# Patient Record
Sex: Female | Born: 1965
Health system: Southern US, Community
[De-identification: ages and names within clinical notes are randomized; demographics above are authoritative.]

## PROBLEM LIST (undated history)

## (undated) DIAGNOSIS — M549 Dorsalgia, unspecified: Secondary | ICD-10-CM

## (undated) DIAGNOSIS — K635 Polyp of colon: Secondary | ICD-10-CM

## (undated) DIAGNOSIS — R51 Headache: Secondary | ICD-10-CM

## (undated) DIAGNOSIS — R519 Headache, unspecified: Secondary | ICD-10-CM

## (undated) HISTORY — PX: COLONOSCOPY: SHX174

## (undated) HISTORY — PX: CHOLECYSTECTOMY: SHX55

## (undated) HISTORY — DX: Polyp of colon: K63.5

## (undated) HISTORY — PX: OTHER SURGICAL HISTORY: SHX169

## (undated) HISTORY — PX: TUBAL LIGATION: SHX77

## (undated) HISTORY — DX: Dorsalgia, unspecified: M54.9

---

## 2001-01-09 ENCOUNTER — Other Ambulatory Visit: Admission: RE | Admit: 2001-01-09 | Discharge: 2001-01-09 | Payer: Self-pay | Admitting: Family Medicine

## 2002-06-05 ENCOUNTER — Other Ambulatory Visit: Admission: RE | Admit: 2002-06-05 | Discharge: 2002-06-05 | Payer: Self-pay | Admitting: Family Medicine

## 2005-09-27 ENCOUNTER — Ambulatory Visit (HOSPITAL_COMMUNITY): Admission: RE | Admit: 2005-09-27 | Discharge: 2005-09-27 | Payer: Self-pay | Admitting: General Surgery

## 2007-07-02 IMAGING — CR DG ABDOMEN ACUTE W/ 1V CHEST
3 series · 3 of 3 positions shown · non-contrast
Comparison: none

HISTORY: Severe constipation, chronic abdominal pain

[view not recorded (1 of 3)]
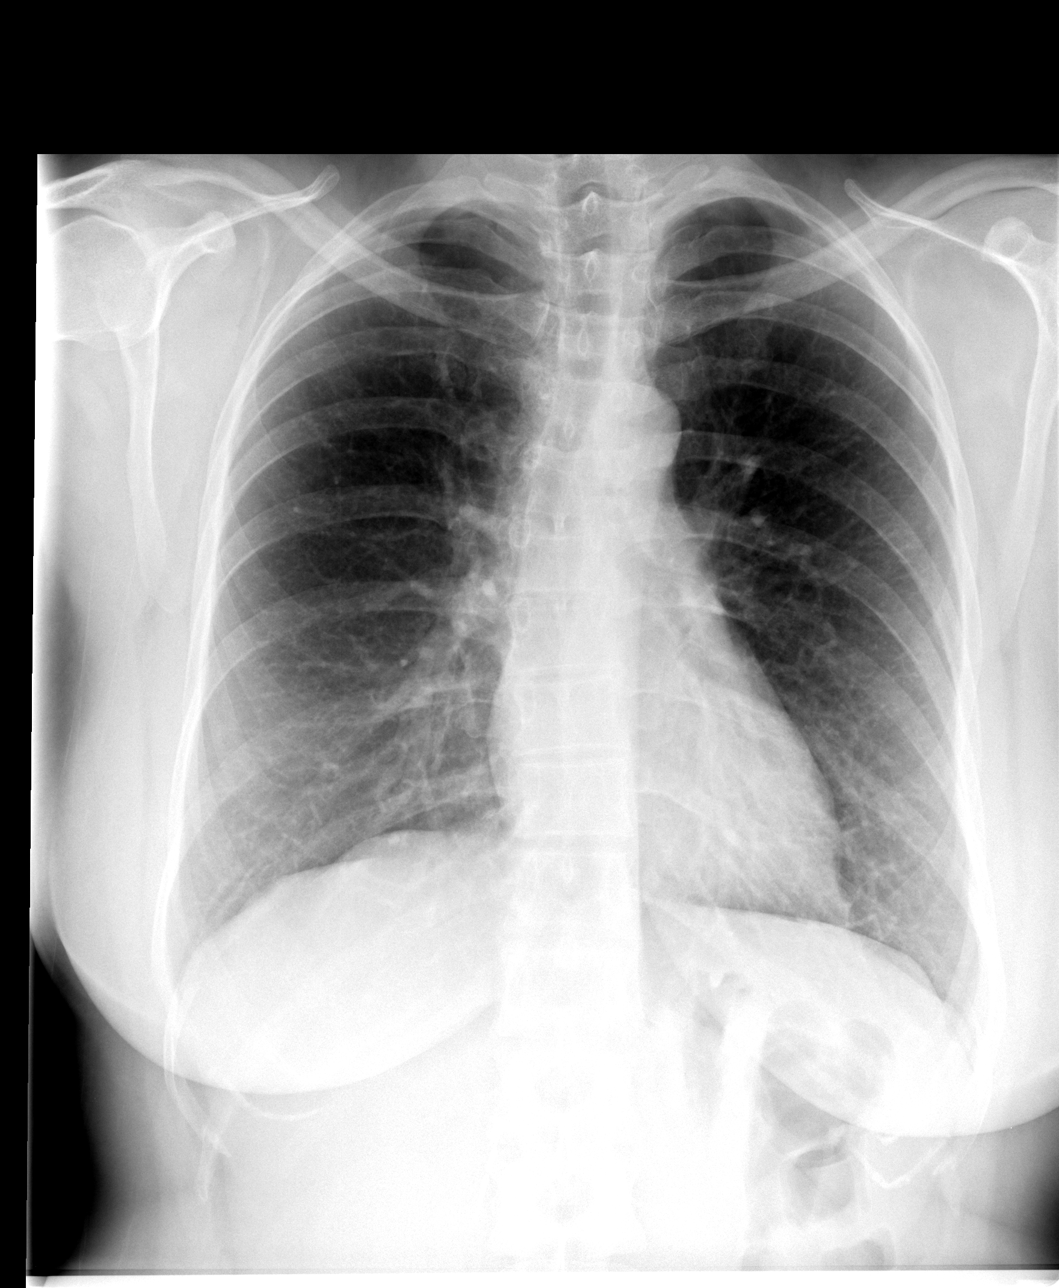

[view not recorded (2 of 3)]
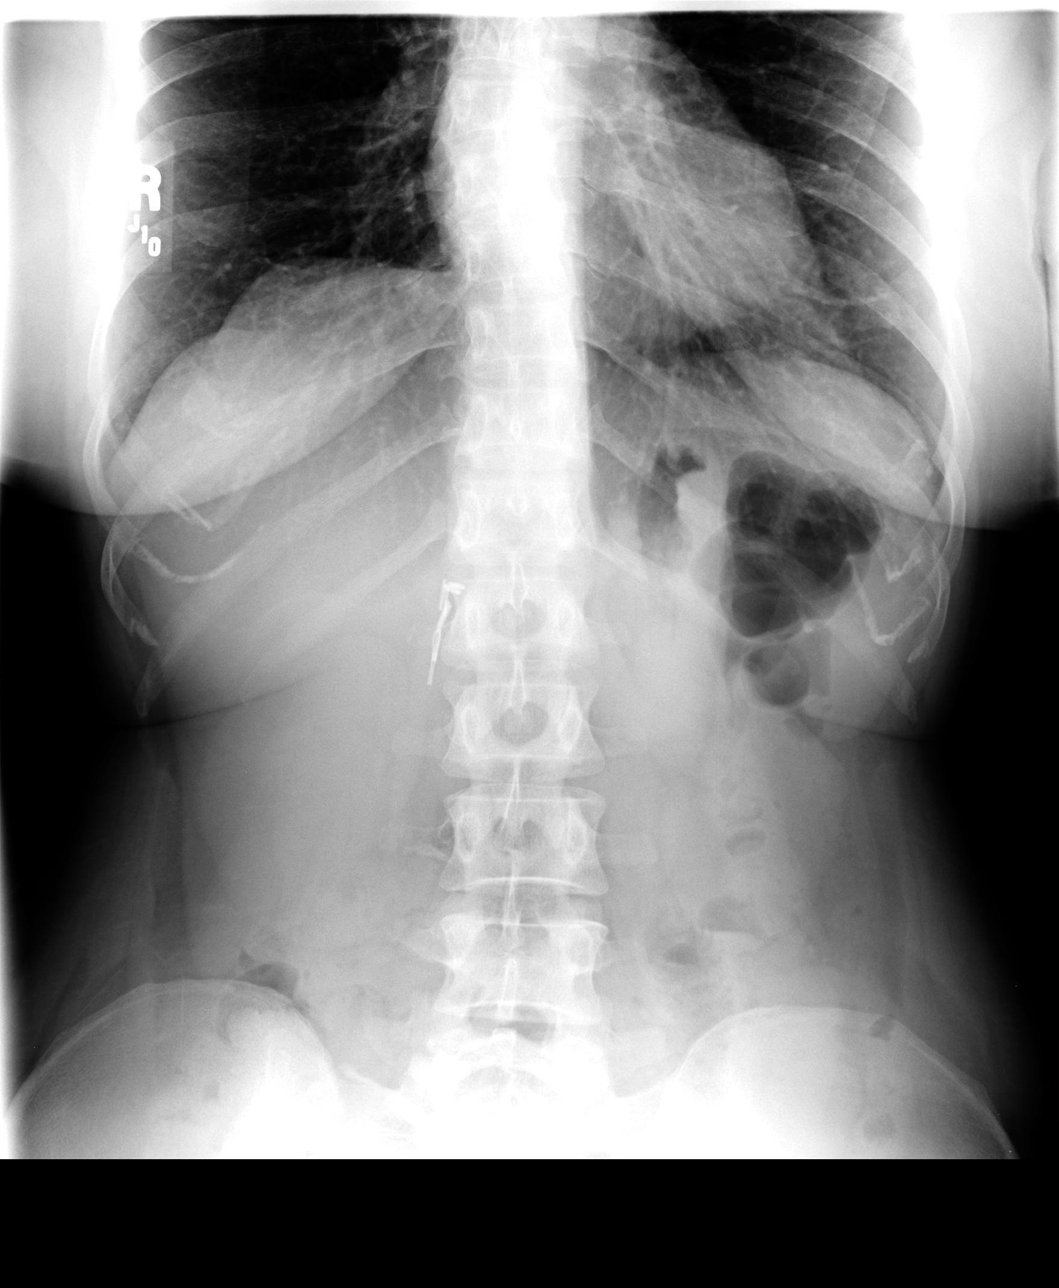

[view not recorded (3 of 3)]
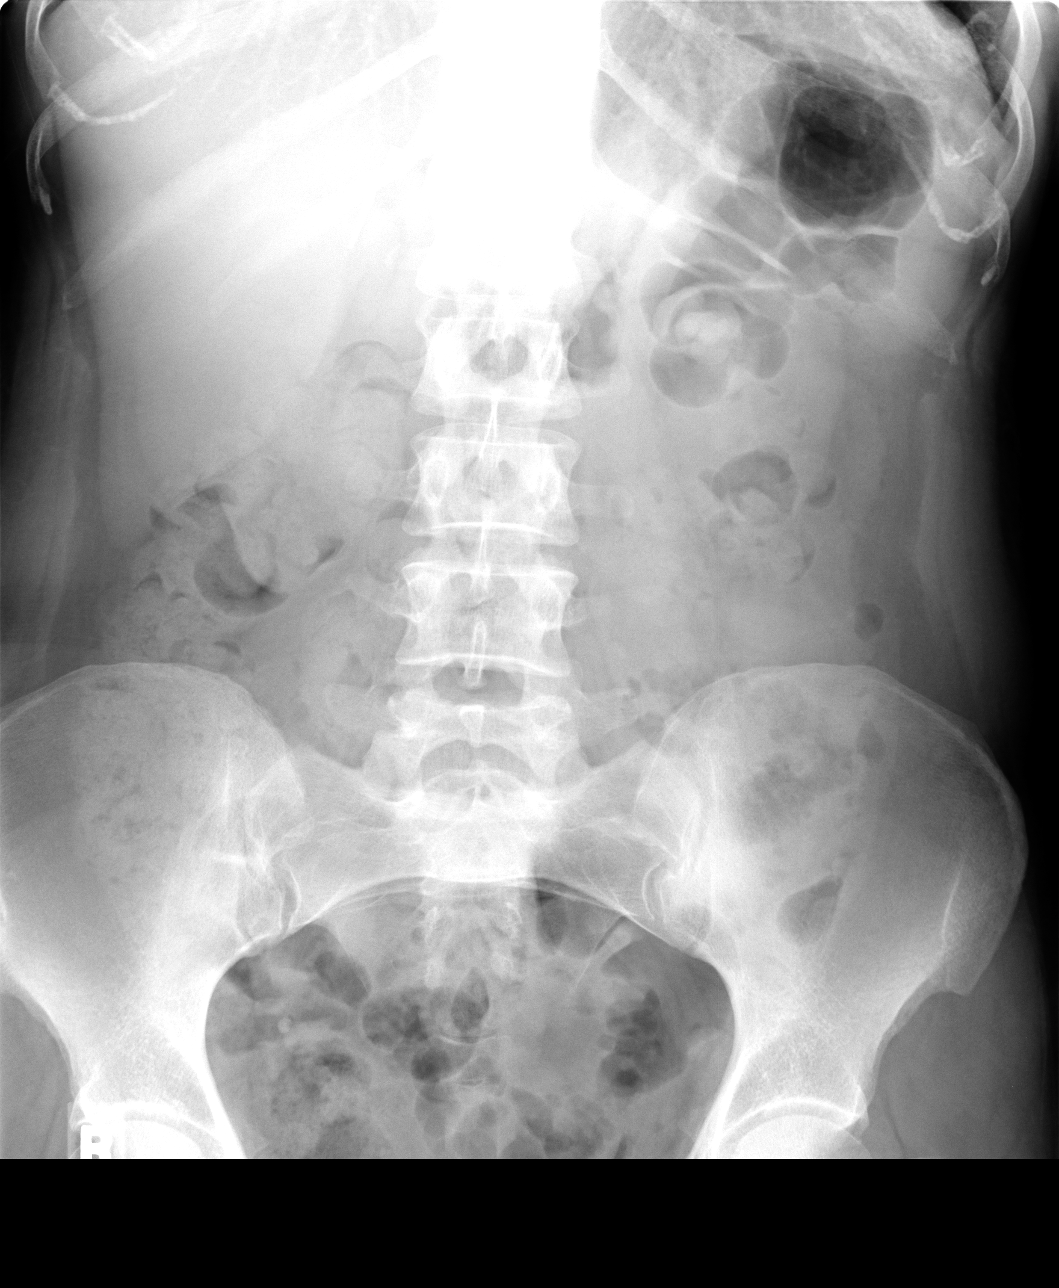

[3 of 3 positions shown; findings below may reference images not displayed]

ABDOMEN ACUTE WITH PA CHEST:

No prior study for comparison.

Normal heart size, mediastinal contours, and pulmonary vascularity.
Mild bronchitic changes.
No infiltrate or effusion.
Minimal biconvex thoracic scoliosis.

Surgical clips right upper quadrant question cholecystectomy.
Mild amount of stool throughout proximal half of colon.
No signs of bowel obstruction, bowel wall thickening, or free air.
Small rounded phlebolith in right pelvis.
Bones unremarkable.
IMPRESSION: Mild bronchitic changes.
No acute abdominal findings.

## 2011-07-24 DIAGNOSIS — J309 Allergic rhinitis, unspecified: Secondary | ICD-10-CM | POA: Insufficient documentation

## 2012-05-16 DIAGNOSIS — J209 Acute bronchitis, unspecified: Secondary | ICD-10-CM | POA: Insufficient documentation

## 2012-06-19 ENCOUNTER — Other Ambulatory Visit (HOSPITAL_COMMUNITY): Payer: Self-pay | Admitting: Orthopaedic Surgery

## 2012-06-19 DIAGNOSIS — M25519 Pain in unspecified shoulder: Secondary | ICD-10-CM

## 2012-06-21 ENCOUNTER — Ambulatory Visit (HOSPITAL_COMMUNITY)
Admission: RE | Admit: 2012-06-21 | Discharge: 2012-06-21 | Disposition: A | Discharge status: Home / Self Care | Payer: BC Managed Care – PPO | Source: Ambulatory Visit | Attending: Orthopaedic Surgery | Admitting: Orthopaedic Surgery

## 2012-06-21 DIAGNOSIS — M67919 Unspecified disorder of synovium and tendon, unspecified shoulder: Secondary | ICD-10-CM | POA: Insufficient documentation

## 2012-06-21 DIAGNOSIS — M25519 Pain in unspecified shoulder: Secondary | ICD-10-CM | POA: Insufficient documentation

## 2012-06-21 DIAGNOSIS — M719 Bursopathy, unspecified: Secondary | ICD-10-CM | POA: Insufficient documentation

## 2014-03-05 DIAGNOSIS — N200 Calculus of kidney: Secondary | ICD-10-CM | POA: Insufficient documentation

## 2015-01-07 DIAGNOSIS — Z6830 Body mass index (BMI) 30.0-30.9, adult: Secondary | ICD-10-CM | POA: Insufficient documentation

## 2016-10-16 DIAGNOSIS — R109 Unspecified abdominal pain: Secondary | ICD-10-CM | POA: Diagnosis not present

## 2016-10-16 DIAGNOSIS — R350 Frequency of micturition: Secondary | ICD-10-CM | POA: Diagnosis not present

## 2017-01-02 DIAGNOSIS — T22211A Burn of second degree of right forearm, initial encounter: Secondary | ICD-10-CM | POA: Diagnosis not present

## 2017-03-06 DIAGNOSIS — N950 Postmenopausal bleeding: Secondary | ICD-10-CM | POA: Diagnosis not present

## 2017-03-06 DIAGNOSIS — G43919 Migraine, unspecified, intractable, without status migrainosus: Secondary | ICD-10-CM | POA: Diagnosis not present

## 2017-03-06 DIAGNOSIS — E782 Mixed hyperlipidemia: Secondary | ICD-10-CM | POA: Diagnosis not present

## 2017-03-16 DIAGNOSIS — N950 Postmenopausal bleeding: Secondary | ICD-10-CM | POA: Diagnosis not present

## 2017-03-23 DIAGNOSIS — E782 Mixed hyperlipidemia: Secondary | ICD-10-CM | POA: Diagnosis not present

## 2017-03-28 DIAGNOSIS — Z86010 Personal history of colonic polyps: Secondary | ICD-10-CM | POA: Diagnosis not present

## 2017-03-28 DIAGNOSIS — Z6831 Body mass index (BMI) 31.0-31.9, adult: Secondary | ICD-10-CM | POA: Insufficient documentation

## 2017-03-28 DIAGNOSIS — E782 Mixed hyperlipidemia: Secondary | ICD-10-CM | POA: Diagnosis not present

## 2017-03-28 DIAGNOSIS — N950 Postmenopausal bleeding: Secondary | ICD-10-CM | POA: Diagnosis not present

## 2017-03-29 ENCOUNTER — Encounter (INDEPENDENT_AMBULATORY_CARE_PROVIDER_SITE_OTHER): Payer: Self-pay | Admitting: *Deleted

## 2017-04-02 DIAGNOSIS — Z6831 Body mass index (BMI) 31.0-31.9, adult: Secondary | ICD-10-CM | POA: Insufficient documentation

## 2017-04-02 DIAGNOSIS — N950 Postmenopausal bleeding: Secondary | ICD-10-CM | POA: Diagnosis not present

## 2017-04-11 DIAGNOSIS — N950 Postmenopausal bleeding: Secondary | ICD-10-CM | POA: Diagnosis not present

## 2017-04-24 DIAGNOSIS — J029 Acute pharyngitis, unspecified: Secondary | ICD-10-CM | POA: Diagnosis not present

## 2017-05-17 ENCOUNTER — Encounter (INDEPENDENT_AMBULATORY_CARE_PROVIDER_SITE_OTHER): Payer: Self-pay | Admitting: Internal Medicine

## 2017-05-17 ENCOUNTER — Encounter (INDEPENDENT_AMBULATORY_CARE_PROVIDER_SITE_OTHER): Payer: Self-pay

## 2017-05-17 ENCOUNTER — Ambulatory Visit (INDEPENDENT_AMBULATORY_CARE_PROVIDER_SITE_OTHER): Payer: 59 | Admitting: Internal Medicine

## 2017-05-17 VITALS — BP 118/88 | HR 74 | Temp 98.0°F | Resp 18 | Ht 65.0 in | Wt 188.5 lb

## 2017-05-17 DIAGNOSIS — K589 Irritable bowel syndrome without diarrhea: Secondary | ICD-10-CM

## 2017-05-17 DIAGNOSIS — K219 Gastro-esophageal reflux disease without esophagitis: Secondary | ICD-10-CM | POA: Diagnosis not present

## 2017-05-17 MED ORDER — BENEFIBER DRINK MIX PO PACK: 4.0000 g | PACK | Freq: Every day | ORAL | Status: DC

## 2017-05-17 MED ORDER — FAMOTIDINE 20 MG PO TABS: 20.0000 mg | ORAL_TABLET | Freq: Two times a day (BID) | ORAL | 5 refills | Status: AC

## 2017-05-17 MED ORDER — POLYETHYLENE GLYCOL 3350 17 GM/SCOOP PO POWD: 17.0000 g | Freq: Every day | ORAL | 5 refills | Status: AC

## 2017-05-17 NOTE — Patient Instructions (Signed)
Will review records of prior colonoscopy and determine the timing of next colonoscopy. Can use Dulcolax suppository on as-needed basis.

## 2017-05-17 NOTE — Progress Notes (Signed)
Presenting complaint;  Irregular bowel movements bloating and history of colonic polyps.  History of present illness:  Early is 51 year old Caucasian female who is referred through courtesy of Dr. Leandrew Koyanagi for GI evaluation. Patient states she is overdue for colonoscopy which she believes was performed by Dr. Laurell Josephs 6 or 7 years ago. She wanted to be seen first because of ongoing GI symptoms. She says she was diagnosed with irritable bowel syndrome about 20 years ago. Lately she's had increasing problem with constipation. She also complains of bloating.  She had Sitzmarks marker study by Dr. Sula Soda in 2007 and was reportedly abnormal. She says she can go 3-5 days without a bowel movement. On day 4 of 5 and she will start taking MiraLAX and fiber supplement and it takes a few days before it kicks in. On days when she has diarrhea she generally has 2 loose stools. She denies melena or rectal bleeding. She also has cramping pain left lower quadrant of her abdomen. She denies nausea or vomiting. She also complains of heartburn with increasing frequency. She's had intermittent heartburn for 10 years. Lately she's been having at least 3-4 episodes per week. She has more heartburn and night when she also has regurgitation. She has been using times with some relief. She also has chest pain which she believes is due to reflux because it is quickly relieved at times. She has very good appetite and her weight has been stable. She denies dysphagia. She also denies rectal bleeding.   Current Medications: Outpatient Encounter Prescriptions as of 05/17/2017  Medication Sig  . loratadine (CLARITIN) 10 MG tablet Take 10 mg by mouth as needed.   . Melatonin ER 5 MG TBCR Take by mouth at bedtime as needed.  . SUMAtriptan (IMITREX) 100 MG tablet Take 100 mg by mouth every 2 (two) hours as needed for migraine. May repeat in 2 hours if headache persists or recurs.   No facility-administered encounter medications on file  as of 05/17/2017.    Past medical history:  Irritable bowel syndrome. Migraine. Hyperlipidemia.. Bilateral tubal ligation 7 years ago. Cholecystectomy for gallstones 27 years ago. Tummy tuck 12 years ago.  Allergies:  No Known Allergies  Family history:  Father is 65 years old and has COPD. Mother is 61 years old and has coronary artery disease. She has 4 brothers and 1 sister. One brother was treated with thyroid carcinoma and his 24s and is doing fine at age 33. All of her brothers have hypertension and hyperlipidemia. Sister is in good health.  Social history:  She is married and has 3 children. Both her daughters and one son are in good health. One of her daughters works in [place] at [employer]. She smokes half a pack of cigarettes per day. She has been smoking for 10 years. She does not drink alcohol. She works with [employer]. She has to stop. She walks at least 5 times a week.   Physical examination: Blood pressure 118/88, pulse 74, temperature 98 F (36.7 C), temperature source Oral, resp. rate 18, height  (1.651 m), weight 188 lb 8 oz (85.5 kg), last menstrual period 05/28/2012. Patient is alert and in no acute distress. Conjunctiva is pink. Sclera is nonicteric Oropharyngeal mucosa is normal. No neck masses or thyromegaly noted. Cardiac exam with regular rhythm normal S1 and S2. No murmur or gallop noted. Lungs are clear to auscultation. Abdomen is full. She has long scar across lower abdomen. Bowel sounds are normal. On palpation  abdomen is soft and nontender without organomegaly or masses.  No LE edema or clubbing noted.  Labs/studies Results:   lab data from 03-26-2017 WBC 6.9, H&H 14.2 and 42.0. Platelet count 313K. Glucose 89, BUN 13, creatinine 0.88. Serum sodium 143, potassium 4.3, chloride 106, CO2 21. Serum calcium 9.7. Bilirubin 0.2, AP 77, AST 21, ALT 16, total protein 7.0 and albumin 4.5. TSH 2.520  Assessment:  #1. Irritable  bowel syndrome. It appears she has constipation predominant IBS. He does not have alarm symptoms. She needs to be taking medication in order to prevent constipation rather than treating constipation. As a first step she will continue polyethylene glycol take on schedule.  #2. History of colonic polyps. Last colonoscopy report he was 6 to 7 years ago. Need to review records for colonoscopy scheduled.  #3. GERD. She has symptoms of uncomplicated GERD. In addition anti-reflex measures will treat her with famotidine and go from there.    Recommendations:  Anti-reflux measures. Famotidine 20 mg by mouth twice a day. Polyethylene glycol 8.5 g by mouth daily. Patient can titrate dose upward or down. Goal is for her have at least a bowel movement every other day. She will avoid taking by mouth laxative but can use Dulcolax suppository an as-needed basis. Request colonoscopy records from [facility] for colonoscopy scheduled.   Addendum: Colonoscopy records from [facility] reviewed. She had colonoscopy 11/09/2007revealing multiple small rectal polyps. 4 removed and se were hyperplastic. She did not have adenomas. She is therefore due for average risk screening colonoscopy and not surveillance colonoscopy. Results given to patient.

## 2017-06-15 ENCOUNTER — Other Ambulatory Visit (INDEPENDENT_AMBULATORY_CARE_PROVIDER_SITE_OTHER): Payer: Self-pay | Admitting: *Deleted

## 2017-06-15 ENCOUNTER — Telehealth (INDEPENDENT_AMBULATORY_CARE_PROVIDER_SITE_OTHER): Payer: Self-pay | Admitting: *Deleted

## 2017-06-15 ENCOUNTER — Encounter (INDEPENDENT_AMBULATORY_CARE_PROVIDER_SITE_OTHER): Payer: Self-pay | Admitting: *Deleted

## 2017-06-15 DIAGNOSIS — Z1211 Encounter for screening for malignant neoplasm of colon: Secondary | ICD-10-CM

## 2017-06-15 NOTE — Telephone Encounter (Signed)
Patient needs trilyte 

## 2017-06-19 MED ORDER — PEG 3350-KCL-NA BICARB-NACL 420 G PO SOLR: 4000.0000 mL | Freq: Once | ORAL | 0 refills | Status: AC

## 2017-06-21 ENCOUNTER — Encounter (INDEPENDENT_AMBULATORY_CARE_PROVIDER_SITE_OTHER): Payer: Self-pay | Admitting: *Deleted

## 2017-07-23 ENCOUNTER — Encounter (INDEPENDENT_AMBULATORY_CARE_PROVIDER_SITE_OTHER): Payer: Self-pay | Admitting: *Deleted

## 2017-09-19 ENCOUNTER — Other Ambulatory Visit: Payer: Self-pay

## 2017-09-19 ENCOUNTER — Encounter (HOSPITAL_COMMUNITY)
Admission: RE | Disposition: A | Discharge status: Home / Self Care | Payer: Self-pay | Source: Ambulatory Visit | Attending: Internal Medicine

## 2017-09-19 ENCOUNTER — Encounter (HOSPITAL_COMMUNITY): Payer: Self-pay | Admitting: *Deleted

## 2017-09-19 ENCOUNTER — Ambulatory Visit (HOSPITAL_COMMUNITY)
Admission: RE | Admit: 2017-09-19 | Discharge: 2017-09-19 | Disposition: A | Discharge status: Home / Self Care | Payer: 59 | Source: Ambulatory Visit | Attending: Internal Medicine | Admitting: Internal Medicine

## 2017-09-19 DIAGNOSIS — F17210 Nicotine dependence, cigarettes, uncomplicated: Secondary | ICD-10-CM | POA: Diagnosis not present

## 2017-09-19 DIAGNOSIS — Z79899 Other long term (current) drug therapy: Secondary | ICD-10-CM | POA: Insufficient documentation

## 2017-09-19 DIAGNOSIS — K644 Residual hemorrhoidal skin tags: Secondary | ICD-10-CM | POA: Insufficient documentation

## 2017-09-19 DIAGNOSIS — Z1211 Encounter for screening for malignant neoplasm of colon: Secondary | ICD-10-CM | POA: Diagnosis not present

## 2017-09-19 DIAGNOSIS — K5730 Diverticulosis of large intestine without perforation or abscess without bleeding: Secondary | ICD-10-CM

## 2017-09-19 DIAGNOSIS — K6289 Other specified diseases of anus and rectum: Secondary | ICD-10-CM | POA: Diagnosis not present

## 2017-09-19 HISTORY — PX: COLONOSCOPY: SHX5424

## 2017-09-19 HISTORY — DX: Headache: R51

## 2017-09-19 HISTORY — DX: Headache, unspecified: R51.9

## 2017-09-19 SURGERY — COLONOSCOPY
Anesthesia: Moderate Sedation

## 2017-09-19 MED ORDER — MIDAZOLAM HCL 5 MG/5ML IJ SOLN
INTRAMUSCULAR | Status: AC
  Filled 2017-09-19: qty 10

## 2017-09-19 MED ORDER — MEPERIDINE HCL 50 MG/ML IJ SOLN
INTRAMUSCULAR | Status: DC | PRN
  Administered 2017-09-19 (×3): 25 mg via INTRAVENOUS

## 2017-09-19 MED ORDER — STERILE WATER FOR IRRIGATION IR SOLN
Status: DC | PRN
  Administered 2017-09-19: 13:00:00

## 2017-09-19 MED ORDER — MEPERIDINE HCL 50 MG/ML IJ SOLN
INTRAMUSCULAR | Status: AC
  Filled 2017-09-19: qty 1

## 2017-09-19 MED ORDER — SODIUM CHLORIDE 0.9 % IV SOLN
INTRAVENOUS | Status: DC
  Administered 2017-09-19: 12:00:00 via INTRAVENOUS

## 2017-09-19 MED ORDER — MIDAZOLAM HCL 5 MG/5ML IJ SOLN
INTRAMUSCULAR | Status: DC | PRN
  Administered 2017-09-19 (×5): 2 mg via INTRAVENOUS

## 2017-09-19 NOTE — Op Note (Signed)
[facility] Patient Name: Adriana Harris Procedure Date: 09/19/2017 11:50 AM MRN: [id number] Date of Birth: [birth year] Attending MD: Lionel December , MD CSN: [id number] Age: 52 Admit Type: Outpatient Procedure:                Colonoscopy Indications:              Screening for colorectal malignant neoplasm Providers:                Lionel December, MD, Loma Messing B. Patsy Lager, RN, Dyann Ruddle Referring MD:             Juliette Alcide, MD Medicines:                Meperidine 75 mg IV, Midazolam 10 mg IV Complications:            No immediate complications. Estimated Blood Loss:     Estimated blood loss: none. Procedure:                Pre-Anesthesia Assessment:                           - Prior to the procedure, a History and Physical                            was performed, and patient medications and                            allergies were reviewed. The patient's tolerance of                            previous anesthesia was also reviewed. The risks                            and benefits of the procedure and the sedation                            options and risks were discussed with the patient.                            All questions were answered, and informed consent                            was obtained. Prior Anticoagulants: The patient has                            taken no previous anticoagulant or antiplatelet                            agents. ASA Grade Assessment: I - A normal, healthy                            patient. After reviewing the risks and benefits,  the patient was deemed in satisfactory condition to                            undergo the procedure.                           After obtaining informed consent, the colonoscope                            was passed under direct vision. Throughout the                            procedure, the patient's blood pressure, pulse, and   oxygen saturations were monitored continuously. The                            [id number] ([id number] scope was introduced through                            the anus and advanced to the the cecum, identified                            by appendiceal orifice and ileocecal valve. The                            colonoscopy was somewhat difficult due to a                            tortuous hepatic flexure. The patient tolerated the                            procedure well. The quality of the bowel                            preparation was good. The ileocecal valve,                            appendiceal orifice, and rectum were photographed. Scope In: 12:56:22 PM Scope Out: 1:18:11 PM Scope Withdrawal Time: 0 hours 7 minutes 38 seconds  Total Procedure Duration: 0 hours 21 minutes 49 seconds  Findings:      The perianal and digital rectal examinations were normal.      A few small-mouthed diverticula were found in the sigmoid colon and       splenic flexure.      The exam was otherwise normal throughout the examined colon.      Anal papilla(e) were hypertrophied.      External hemorrhoids were found during retroflexion. The hemorrhoids       were small. Impression:               - Diverticulosis in the sigmoid colon and at the                            splenic flexure.                           -  Anal papilla(e) were hypertrophied.                           - External hemorrhoids.                           - No specimens collected. Moderate Sedation:      Moderate (conscious) sedation was administered by the endoscopy nurse       and supervised by the endoscopist. The following parameters were       monitored: oxygen saturation, heart rate, blood pressure, CO2       capnography and response to care. Total physician intraservice time was       29 minutes. Recommendation:           - Patient has a contact number available for                            emergencies. The signs and  symptoms of potential                            delayed complications were discussed with the                            patient. Return to normal activities tomorrow.                            Written discharge instructions were provided to the                            patient.                           - High fiber diet today.                           - Continue present medications.                           - Repeat colonoscopy in 10 years for screening                            purposes. Procedure Code(s):        --- Professional ---                           [phone], Colonoscopy, flexible; diagnostic, including                            collection of specimen(s) by brushing or washing,                            when performed (separate procedure)                           99152, Moderate sedation services provided by the  same physician or other qualified health care                            professional performing the diagnostic or                            therapeutic service that the sedation supports,                            requiring the presence of an independent trained                            observer to assist in the monitoring of the                            patient's level of consciousness and physiological                            status; initial 15 minutes of intraservice time,                            patient age 16 years or older                           [phone], Moderate sedation services; each additional                            15 minutes intraservice time Diagnosis Code(s):        --- Professional ---                           Z12.11, Encounter for screening for malignant                            neoplasm of colon                           K64.4, Residual hemorrhoidal skin tags                           K62.89, Other specified diseases of anus and rectum                           K57.30, Diverticulosis of large  intestine without                            perforation or abscess without bleeding CPT copyright 2016 American Medical Association. All rights reserved. The codes documented in this report are preliminary and upon coder review may  be revised to meet current compliance requirements. Lionel December, MD Lionel December, MD 09/19/2017 1:26:43 PM This report has been signed electronically. Number of Addenda: 0

## 2017-09-19 NOTE — H&P (Signed)
Adriana Harris is an 52 y.o. female.   Chief Complaint: Patient is here for colonoscopy. HPI: Patient is 52 year old Caucasian female who is here for average risk screening colonoscopy.  She denies abdominal pain change in bowel habits or rectal bleeding. Last colonoscopy was in 2007 with removal of small rectal polyps of these are hypoplastic.  She has never had adenomas. Family history is negative for CRC.  Past Medical History:  Diagnosis Date  . Arthralgia of back       . Headache    history of migraines    Past Surgical History:  Procedure Laterality Date  . CHOLECYSTECTOMY     @ age 36  . COLONOSCOPY     2007; hyperplastic rectal polyps.  . TUBAL LIGATION     per patient 27 years ago  . Tummy Tuck     12 years ago.    Family History  Problem Relation Age of Onset  . Heart disease Mother   . High Cholesterol Mother   . Hypertension Mother   . Migraines Mother   . Diverticulitis Mother   . Irritable bowel syndrome Mother   . Diabetes Father   . COPD Father   . Hyperthyroidism Father   . High Cholesterol Sister   . Scoliosis Sister   . Thyroid cancer Brother   . Hypertension Brother   . High Cholesterol Brother   . Hypertension Brother   . High Cholesterol Brother   . Diabetes Brother   . High Cholesterol Brother   . Hypertension Brother   . Migraines Brother   . Hyperthyroidism Brother   . Healthy Daughter   . Irritable bowel syndrome Daughter   . Hypertension Son   . Migraines Son   . Hyperthyroidism Son   . Colon cancer Neg Hx    Social History:  reports that she has been smoking cigarettes.  She has a 7.50 pack-year smoking history. she has never used smokeless tobacco. She reports that she drinks alcohol. She reports that she does not use drugs.  Allergies: No Known Allergies  Medications Prior to Admission  Medication Sig Dispense Refill  . famotidine (PEPCID) 20 MG tablet Take 1 tablet (20 mg total) by mouth 2 (two) times daily. 60 tablet 5  .  Melatonin ER 5 MG TBCR Take 5 mg by mouth at bedtime as needed (for sleep).     . polyethylene glycol powder (GLYCOLAX/MIRALAX) powder Take 17 g by mouth daily. (Patient taking differently: Take 17 g by mouth daily as needed for moderate constipation. ) 500 g 5  . SUMAtriptan (IMITREX) 100 MG tablet Take 100 mg by mouth every 2 (two) hours as needed for migraine. May repeat in 2 hours if headache persists or recurs.    Marland Kitchen loratadine (CLARITIN) 10 MG tablet Take 10 mg by mouth daily as needed for allergies.     . Wheat Dextrin (BENEFIBER DRINK MIX) PACK Take 4 g by mouth at bedtime. (Patient not taking: Reported on 09/07/2017)      No results found for this or any previous visit (from the past 48 hour(s)). No results found.  ROS  Blood pressure 125/90, pulse 92, temperature 97.9 F (36.6 C), temperature source Oral, resp. rate 16, height 5\' 5"  (1.651 m), weight 181 lb (82.1 kg), last menstrual period 05/28/2012, SpO2 100 %. Physical Exam  Constitutional: She appears well-developed and well-nourished.  HENT:  Mouth/Throat: Oropharynx is clear and moist.  Eyes: Conjunctivae are normal. No scleral icterus.  Neck: No  thyromegaly present.  Cardiovascular: Normal rate, regular rhythm and normal heart sounds.  No murmur heard. Respiratory: Effort normal and breath sounds normal.  GI: Soft. She exhibits no distension and no mass. There is no tenderness.  Long scar across lower abdomen.  Musculoskeletal: She exhibits no edema.  Lymphadenopathy:    She has no cervical adenopathy.  Neurological: She is alert.  Skin: Skin is warm and dry.     Assessment/Plan Average risk screening colonoscopy.  Lionel DecemberNajeeb Myles Mallicoat, MD 09/19/2017, 12:42 PM

## 2017-09-19 NOTE — Discharge Instructions (Signed)
Resume usual medications as before. °High-fiber diet. °No driving for 24 hours. °Next screening exam in 10 years. ° ° °Colonoscopy, Adult, Care After °This sheet gives you information about how to care for yourself after your procedure. Your doctor may also give you more specific instructions. If you have problems or questions, call your doctor. °Follow these instructions at home: °General instructions ° °· For the first 24 hours after the procedure: °? Do not drive or use machinery. °? Do not sign important documents. °? Do not drink alcohol. °? Do your daily activities more slowly than normal. °? Eat foods that are soft and easy to digest. °? Rest often. °· Take over-the-counter or prescription medicines only as told by your doctor. °· It is up to you to get the results of your procedure. Ask your doctor, or the department performing the procedure, when your results will be ready. °To help cramping and bloating: °· Try walking around. °· Put heat on your belly (abdomen) as told by your doctor. Use a heat source that your doctor recommends, such as a moist heat pack or a heating pad. °? Put a towel between your skin and the heat source. °? Leave the heat on for 20-30 minutes. °? Remove the heat if your skin turns bright red. This is especially important if you cannot feel pain, heat, or cold. You can get burned. °Eating and drinking °· Drink enough fluid to keep your pee (urine) clear or pale yellow. °· Return to your normal diet as told by your doctor. Avoid heavy or fried foods that are hard to digest. °· Avoid drinking alcohol for as long as told by your doctor. °Contact a doctor if: °· You have blood in your poop (stool) 2-3 days after the procedure. °Get help right away if: °· You have more than a small amount of blood in your poop. °· You see large clumps of tissue (blood clots) in your poop. °· Your belly is swollen. °· You feel sick to your stomach (nauseous). °· You throw up (vomit). °· You have a  fever. °· You have belly pain that gets worse, and medicine does not help your pain. °This information is not intended to replace advice given to you by your health care provider. Make sure you discuss any questions you have with your health care provider. °Document Released: 09/09/2010 Document Revised: 05/01/2016 Document Reviewed: 05/01/2016 °Elsevier Interactive Patient Education © 2017 [employer] ° °Diverticulosis °Diverticulosis is a condition that develops when small pouches (diverticula) form in the wall of the large intestine (colon). The colon is where water is absorbed and stool is formed. The pouches form when the inside layer of the colon pushes through weak spots in the outer layers of the colon. You may have a few pouches or many of them. °What are the causes? °The cause of this condition is not known. °What increases the risk? °The following factors may make you more likely to develop this condition: °· Being older than age 60. Your risk for this condition increases with age. Diverticulosis is rare among people younger than age 30. By age 80, many people have it. °· Eating a low-fiber diet. °· Having frequent constipation. °· Being overweight. °· Not getting enough exercise. °· Smoking. °· Taking over-the-counter pain medicines, like aspirin and ibuprofen. °· Having a family history of diverticulosis. ° °What are the signs or symptoms? °In most people, there are no symptoms of this condition. If you do have symptoms, they may include: °·   Bloating. °· Cramps in the abdomen. °· Constipation or diarrhea. °· Pain in the lower left side of the abdomen. ° °How is this diagnosed? °This condition is most often diagnosed during an exam for other colon problems. Because diverticulosis usually has no symptoms, it often cannot be diagnosed independently. This condition may be diagnosed by: °· Using a flexible scope to examine the colon (colonoscopy). °· Taking an X-ray of the colon after dye has been put into  the colon (barium enema). °· Doing a CT scan. ° °How is this treated? °You may not need treatment for this condition if you have never developed an infection related to diverticulosis. If you have had an infection before, treatment may include: °· Eating a high-fiber diet. This may include eating more fruits, vegetables, and grains. °· Taking a fiber supplement. °· Taking a live bacteria supplement (probiotic). °· Taking medicine to relax your colon. °· Taking antibiotic medicines. ° °Follow these instructions at home: °· Drink 6-8 glasses of water or more each day to prevent constipation. °· Try not to strain when you have a bowel movement. °· If you have had an infection before: °? Eat more fiber as directed by your health care provider or your diet and nutrition specialist (dietitian). °? Take a fiber supplement or probiotic, if your health care provider approves. °· Take over-the-counter and prescription medicines only as told by your health care provider. °· If you were prescribed an antibiotic, take it as told by your health care provider. Do not stop taking the antibiotic even if you start to feel better. °· Keep all follow-up visits as told by your health care provider. This is important. °Contact a health care provider if: °· You have pain in your abdomen. °· You have bloating. °· You have cramps. °· You have not had a bowel movement in 3 days. °Get help right away if: °· Your pain gets worse. °· Your bloating becomes very bad. °· You have a fever or chills, and your symptoms suddenly get worse. °· You vomit. °· You have bowel movements that are bloody or black. °· You have bleeding from your rectum. °Summary °· Diverticulosis is a condition that develops when small pouches (diverticula) form in the wall of the large intestine (colon). °· You may have a few pouches or many of them. °· This condition is most often diagnosed during an exam for other colon problems. °· If you have had an infection related to  diverticulosis, treatment may include increasing the fiber in your diet, taking supplements, or taking medicines. °This information is not intended to replace advice given to you by your health care provider. Make sure you discuss any questions you have with your health care provider. °Document Released: 05/04/2004 Document Revised: 06/26/2016 Document Reviewed: 06/26/2016 °Elsevier Interactive Patient Education © 2017 [employer] ° ° °Hemorrhoids °Hemorrhoids are swollen veins in and around the rectum or anus. There are two types of hemorrhoids: °· Internal hemorrhoids. These occur in the veins that are just inside the rectum. They may poke through to the outside and become irritated and painful. °· External hemorrhoids. These occur in the veins that are outside of the anus and can be felt as a painful swelling or hard lump near the anus. ° °Most hemorrhoids do not cause serious problems, and they can be managed with home treatments such as diet and lifestyle changes. If home treatments do not help your symptoms, procedures can be done to shrink or remove the hemorrhoids. °What are   the causes? °This condition is caused by increased pressure in the anal area. This pressure may result from various things, including: °· Constipation. °· Straining to have a bowel movement. °· Diarrhea. °· Pregnancy. °· Obesity. °· Sitting for long periods of time. °· Heavy lifting or other activity that causes you to strain. °· Anal sex. ° °What are the signs or symptoms? °Symptoms of this condition include: °· Pain. °· Anal itching or irritation. °· Rectal bleeding. °· Leakage of stool (feces). °· Anal swelling. °· One or more lumps around the anus. ° °How is this diagnosed? °This condition can often be diagnosed through a visual exam. Other exams or tests may also be done, such as: °· Examination of the rectal area with a gloved hand (digital rectal exam). °· Examination of the anal canal using a small tube (anoscope). °· A blood  test, if you have lost a significant amount of blood. °· A test to look inside the colon (sigmoidoscopy or colonoscopy). ° °How is this treated? °This condition can usually be treated at home. However, various procedures may be done if dietary changes, lifestyle changes, and other home treatments do not help your symptoms. These procedures can help make the hemorrhoids smaller or remove them completely. Some of these procedures involve surgery, and others do not. Common procedures include: °· Rubber band ligation. Rubber bands are placed at the base of the hemorrhoids to cut off the blood supply to them. °· Sclerotherapy. Medicine is injected into the hemorrhoids to shrink them. °· Infrared coagulation. A type of light energy is used to get rid of the hemorrhoids. °· Hemorrhoidectomy surgery. The hemorrhoids are surgically removed, and the veins that supply them are tied off. °· Stapled hemorrhoidopexy surgery. A circular stapling device is used to remove the hemorrhoids and use staples to cut off the blood supply to them. ° °Follow these instructions at home: °Eating and drinking °· Eat foods that have a lot of fiber in them, such as whole grains, beans, nuts, fruits, and vegetables. Ask your health care provider about taking products that have added fiber (fiber supplements). °· Drink enough fluid to keep your urine clear or pale yellow. °Managing pain and swelling °· Take warm sitz baths for 20 minutes, 3-4 times a day to ease pain and discomfort. °· If directed, apply ice to the affected area. Using ice packs between sitz baths may be helpful. °? Put ice in a plastic bag. °? Place a towel between your skin and the bag. °? Leave the ice on for 20 minutes, 2-3 times a day. °General instructions °· Take over-the-counter and prescription medicines only as told by your health care provider. °· Use medicated creams or suppositories as told. °· Exercise regularly. °· Go to the bathroom when you have the urge to have a  bowel movement. Do not wait. °· Avoid straining to have bowel movements. °· Keep the anal area dry and clean. Use wet toilet paper or moist towelettes after a bowel movement. °· Do not sit on the toilet for long periods of time. This increases blood pooling and pain. °Contact a health care provider if: °· You have increasing pain and swelling that are not controlled by treatment or medicine. °· You have uncontrolled bleeding. °· You have difficulty having a bowel movement, or you are unable to have a bowel movement. °· You have pain or inflammation outside the area of the hemorrhoids. °This information is not intended to replace advice given to you by your health   care provider. Make sure you discuss any questions you have with your health care provider. °Document Released: 08/04/2000 Document Revised: 01/05/2016 Document Reviewed: 04/21/2015 °Elsevier Interactive Patient Education © 2018 Elsevier Inc. ° ° °

## 2017-09-19 NOTE — OR Nursing (Signed)
Adriana Harris was at [facility] on 09/19/17 and cannot return to work until 09/20/17 at 2:00 pm.

## 2017-09-20 DIAGNOSIS — N200 Calculus of kidney: Secondary | ICD-10-CM | POA: Diagnosis not present

## 2017-09-20 DIAGNOSIS — E782 Mixed hyperlipidemia: Secondary | ICD-10-CM | POA: Diagnosis not present

## 2017-09-21 ENCOUNTER — Encounter (HOSPITAL_COMMUNITY): Payer: Self-pay | Admitting: Internal Medicine

## 2017-10-22 DIAGNOSIS — E782 Mixed hyperlipidemia: Secondary | ICD-10-CM | POA: Diagnosis not present

## 2017-10-22 DIAGNOSIS — G43919 Migraine, unspecified, intractable, without status migrainosus: Secondary | ICD-10-CM | POA: Diagnosis not present

## 2017-10-22 DIAGNOSIS — Z0001 Encounter for general adult medical examination with abnormal findings: Secondary | ICD-10-CM | POA: Diagnosis not present

## 2017-10-22 DIAGNOSIS — N200 Calculus of kidney: Secondary | ICD-10-CM | POA: Diagnosis not present

## 2017-11-28 DIAGNOSIS — G4733 Obstructive sleep apnea (adult) (pediatric): Secondary | ICD-10-CM | POA: Diagnosis not present

## 2017-11-28 DIAGNOSIS — G4730 Sleep apnea, unspecified: Secondary | ICD-10-CM | POA: Diagnosis not present

## 2018-01-23 DIAGNOSIS — G4730 Sleep apnea, unspecified: Secondary | ICD-10-CM | POA: Diagnosis not present

## 2018-02-20 DIAGNOSIS — G4733 Obstructive sleep apnea (adult) (pediatric): Secondary | ICD-10-CM | POA: Diagnosis not present

## 2018-03-23 DIAGNOSIS — G4733 Obstructive sleep apnea (adult) (pediatric): Secondary | ICD-10-CM | POA: Diagnosis not present

## 2018-04-23 DIAGNOSIS — G4733 Obstructive sleep apnea (adult) (pediatric): Secondary | ICD-10-CM | POA: Diagnosis not present

## 2018-05-20 DIAGNOSIS — Z23 Encounter for immunization: Secondary | ICD-10-CM | POA: Diagnosis not present

## 2018-05-23 DIAGNOSIS — G4733 Obstructive sleep apnea (adult) (pediatric): Secondary | ICD-10-CM | POA: Diagnosis not present

## 2018-06-19 DIAGNOSIS — L308 Other specified dermatitis: Secondary | ICD-10-CM | POA: Diagnosis not present

## 2018-06-19 DIAGNOSIS — L309 Dermatitis, unspecified: Secondary | ICD-10-CM | POA: Diagnosis not present

## 2018-06-23 DIAGNOSIS — G4733 Obstructive sleep apnea (adult) (pediatric): Secondary | ICD-10-CM | POA: Diagnosis not present

## 2018-07-23 DIAGNOSIS — G4733 Obstructive sleep apnea (adult) (pediatric): Secondary | ICD-10-CM | POA: Diagnosis not present

## 2018-07-26 DIAGNOSIS — Z720 Tobacco use: Secondary | ICD-10-CM | POA: Diagnosis not present

## 2018-08-15 DIAGNOSIS — L531 Erythema annulare centrifugum: Secondary | ICD-10-CM | POA: Diagnosis not present

## 2018-08-23 DIAGNOSIS — Z87891 Personal history of nicotine dependence: Secondary | ICD-10-CM | POA: Diagnosis not present

## 2018-08-23 DIAGNOSIS — G4733 Obstructive sleep apnea (adult) (pediatric): Secondary | ICD-10-CM | POA: Diagnosis not present

## 2018-09-23 DIAGNOSIS — G4733 Obstructive sleep apnea (adult) (pediatric): Secondary | ICD-10-CM | POA: Diagnosis not present

## 2018-10-22 DIAGNOSIS — G4733 Obstructive sleep apnea (adult) (pediatric): Secondary | ICD-10-CM | POA: Diagnosis not present

## 2018-11-22 DIAGNOSIS — G4733 Obstructive sleep apnea (adult) (pediatric): Secondary | ICD-10-CM | POA: Diagnosis not present

## 2019-10-14 ENCOUNTER — Ambulatory Visit (INDEPENDENT_AMBULATORY_CARE_PROVIDER_SITE_OTHER): Payer: 59 | Admitting: Orthopaedic Surgery

## 2019-10-14 ENCOUNTER — Other Ambulatory Visit: Payer: Self-pay

## 2019-10-14 ENCOUNTER — Ambulatory Visit: Payer: 59

## 2019-10-14 ENCOUNTER — Encounter: Payer: Self-pay | Admitting: Orthopaedic Surgery

## 2019-10-14 VITALS — Temp 97.9°F | Resp 16 | Ht 65.0 in | Wt 205.0 lb

## 2019-10-14 DIAGNOSIS — M25512 Pain in left shoulder: Secondary | ICD-10-CM

## 2019-10-14 DIAGNOSIS — G8929 Other chronic pain: Secondary | ICD-10-CM

## 2019-10-14 MED ORDER — NAPROXEN 500 MG PO TABS: 500.0000 mg | ORAL_TABLET | Freq: Two times a day (BID) | ORAL | 5 refills | Status: AC

## 2019-10-14 NOTE — Progress Notes (Signed)
Subjective:    Patient ID: Adriana Harris, female    DOB: Jan 21, 1966, 54 y.o.   MRN: [id number]  Shoulder Pain    She has had pain of the left shoulder since an injury three months ago.  She was walking her 110 pound dog and it darted out. She had it on a leash.  It pulled her into a car and she hit her left shoulder.  It has hurt her since then, some good days, some bad, not getting any better.  She has tried Tylenol, ice and heat with no help.  She has pain with overhead use and laying on it at night.  She has no weakness, no swelling, no numbness, no redness.  Review of Systems  Musculoskeletal: Positive for joint pain and myalgias.  Neurological: Positive for headaches.  All other systems reviewed and are negative.  For Review of Systems, all other systems reviewed and are negative.  The following is a summary of the past history medically, past history surgically, known current medicines, social history and family history.  This information is gathered electronically by the computer from prior information and documentation.  I review this each visit and have found including this information at this point in the chart is beneficial and informative.   Past Medical History:  Diagnosis Date  . Arthralgia of back   . Colon polyp   . Headache    history of migraines    Past Surgical History:  Procedure Laterality Date  . CHOLECYSTECTOMY     @ age 43  . COLONOSCOPY     6-7 years ago in [place].  . COLONOSCOPY N/A 09/19/2017   Procedure: COLONOSCOPY;  Surgeon: Malissa Hippo, MD;  Location: [facility];  Service: Endoscopy;  Laterality: N/A;  1255  . TUBAL LIGATION     per patient 27 years ago  . Tummy Tuck     12 years ago.    Current Outpatient Medications on File Prior to Visit  Medication Sig Dispense Refill  . famotidine (PEPCID) 20 MG tablet Take 1 tablet (20 mg total) by mouth 2 (two) times daily. 60 tablet 5  . loratadine (CLARITIN) 10 MG tablet Take 10 mg by mouth  daily as needed for allergies.     . Melatonin ER 5 MG TBCR Take 5 mg by mouth at bedtime as needed (for sleep).     . polyethylene glycol powder (GLYCOLAX/MIRALAX) powder Take 17 g by mouth daily. (Patient taking differently: Take 17 g by mouth daily as needed for moderate constipation. ) 500 g 5  . SUMAtriptan (IMITREX) 100 MG tablet Take 100 mg by mouth every 2 (two) hours as needed for migraine. May repeat in 2 hours if headache persists or recurs.     No current facility-administered medications on file prior to visit.    Social History   Socioeconomic History  . Marital status: Married    Spouse name: Not on file  . Number of children: Not on file  . Years of education: Not on file  . Highest education level: Not on file  Occupational History  . Not on file  Tobacco Use  . Smoking status: Current Every Day Smoker    Packs/day: 0.50    Years: 15.00    Pack years: 7.50    Types: Cigarettes  . Smokeless tobacco: Never Used  Substance and Sexual Activity  . Alcohol use: Yes    Comment: occasionally  . Drug use: No  . Sexual  activity: Not on file  Other Topics Concern  . Not on file  Social History Narrative  . Not on file   Social Determinants of Health   Financial Resource Strain:   . Difficulty of Paying Living Expenses: Not on file  Food Insecurity:   . Worried About Programme researcher, broadcasting/film/video in the Last Year: Not on file  . Ran Out of Food in the Last Year: Not on file  Transportation Needs:   . Lack of Transportation (Medical): Not on file  . Lack of Transportation (Non-Medical): Not on file  Physical Activity:   . Days of Exercise per Week: Not on file  . Minutes of Exercise per Session: Not on file  Stress:   . Feeling of Stress : Not on file  Social Connections:   . Frequency of Communication with Friends and Family: Not on file  . Frequency of Social Gatherings with Friends and Family: Not on file  . Attends Religious Services: Not on file  . Active Member  of Clubs or Organizations: Not on file  . Attends Banker Meetings: Not on file  . Marital Status: Not on file  Intimate Partner Violence:   . Fear of Current or Ex-Partner: Not on file  . Emotionally Abused: Not on file  . Physically Abused: Not on file  . Sexually Abused: Not on file    Family History  Problem Relation Age of Onset  . Heart disease Mother   . High Cholesterol Mother   . Hypertension Mother   . Migraines Mother   . Diverticulitis Mother   . Irritable bowel syndrome Mother   . Diabetes Father   . COPD Father   . Hyperthyroidism Father   . High Cholesterol Sister   . Scoliosis Sister   . Thyroid cancer Brother   . Hypertension Brother   . High Cholesterol Brother   . Hypertension Brother   . High Cholesterol Brother   . Diabetes Brother   . High Cholesterol Brother   . Hypertension Brother   . Migraines Brother   . Hyperthyroidism Brother   . Healthy Daughter   . Irritable bowel syndrome Daughter   . Hypertension Son   . Migraines Son   . Hyperthyroidism Son   . Colon cancer Neg Hx     Temp 97.9 F (36.6 C)   Resp 16   Ht 5\' 5"  (1.651 m)   Wt 205 lb (93 kg)   LMP 05/28/2012   BMI 34.11 kg/m   Body mass index is 34.11 kg/m.       Objective:   Physical Exam Vitals and nursing note reviewed.  Constitutional:      Appearance: She is well-developed.  HENT:     Head: Normocephalic and atraumatic.  Eyes:     Conjunctiva/sclera: Conjunctivae normal.     Pupils: Pupils are equal, round, and reactive to light.  Cardiovascular:     Rate and Rhythm: Normal rate and regular rhythm.  Pulmonary:     Effort: Pulmonary effort is normal.  Abdominal:     Palpations: Abdomen is soft.  Musculoskeletal:       Arms:     Cervical back: Normal range of motion and neck supple.  Skin:    General: Skin is warm and dry.  Neurological:     Mental Status: She is alert and oriented to person, place, and time.     Cranial Nerves: No cranial  nerve deficit.     Motor: No  abnormal muscle tone.     Coordination: Coordination normal.     Deep Tendon Reflexes: Reflexes are normal and symmetric. Reflexes normal.  Psychiatric:        Behavior: Behavior normal.        Thought Content: Thought content normal.        Judgment: Judgment normal.    X-rays were done of the left shoulder, reported separately.       Assessment & Plan:   Encounter Diagnosis  Name Primary?  . Chronic left shoulder pain Yes   I will give Naprosyn 500 po bid pc.  I have explained exercises to do.  Return in three weeks.  Call if any problem.  Precautions discussed.   Electronically Signed Sanjuana Kava, MD 2/23/202111:19 AM

## 2019-11-04 ENCOUNTER — Other Ambulatory Visit: Payer: Self-pay

## 2019-11-04 ENCOUNTER — Encounter: Payer: Self-pay | Admitting: Orthopaedic Surgery

## 2019-11-04 ENCOUNTER — Ambulatory Visit: Payer: 59 | Admitting: Orthopaedic Surgery

## 2019-11-04 VITALS — HR 97 | Resp 16 | Ht 65.0 in | Wt 210.0 lb

## 2019-11-04 DIAGNOSIS — M25512 Pain in left shoulder: Secondary | ICD-10-CM

## 2019-11-04 DIAGNOSIS — G8929 Other chronic pain: Secondary | ICD-10-CM

## 2019-11-04 NOTE — Progress Notes (Signed)
Patient Adriana Harris, female DOB:08-04-1966, 54 y.o. ASN:[id number]  Chief Complaint  Patient presents with  . Shoulder Pain    left/ better still has pain at night     HPI  Adriana Harris is a 54 y.o. female who has left shoulder pain.  She is better.  She has better motion but has pain more at night sleeping on it.  She is doing her exercises.  She has no numbness.  She is taking her Naprosyn.   Body mass index is 34.95 kg/m.  ROS  Review of Systems  Constitutional: Positive for activity change.  Musculoskeletal: Positive for arthralgias.  All other systems reviewed and are negative.   All other systems reviewed and are negative.  The following is a summary of the past history medically, past history surgically, known current medicines, social history and family history.  This information is gathered electronically by the computer from prior information and documentation.  I review this each visit and have found including this information at this point in the chart is beneficial and informative.    Past Medical History:  Diagnosis Date  . Arthralgia of back   . Colon polyp   . Headache    history of migraines    Past Surgical History:  Procedure Laterality Date  . CHOLECYSTECTOMY     @ age 8  . COLONOSCOPY     6-7 years ago in [place].  . COLONOSCOPY N/A 09/19/2017   Procedure: COLONOSCOPY;  Surgeon: Rogene Houston, MD;  Location: [facility];  Service: Endoscopy;  Laterality: N/A;  1255  . TUBAL LIGATION     per patient 27 years ago  . Tummy Tuck     12 years ago.    Family History  Problem Relation Age of Onset  . Heart disease Mother   . High Cholesterol Mother   . Hypertension Mother   . Migraines Mother   . Diverticulitis Mother   . Irritable bowel syndrome Mother   . Diabetes Father   . COPD Father   . Hyperthyroidism Father   . High Cholesterol Sister   . Scoliosis Sister   . Thyroid cancer Brother   . Hypertension Brother   . High  Cholesterol Brother   . Hypertension Brother   . High Cholesterol Brother   . Diabetes Brother   . High Cholesterol Brother   . Hypertension Brother   . Migraines Brother   . Hyperthyroidism Brother   . Healthy Daughter   . Irritable bowel syndrome Daughter   . Hypertension Son   . Migraines Son   . Hyperthyroidism Son   . Colon cancer Neg Hx     Social History Social History   Tobacco Use  . Smoking status: Current Every Day Smoker    Packs/day: 0.50    Years: 15.00    Pack years: 7.50    Types: Cigarettes  . Smokeless tobacco: Never Used  Substance Use Topics  . Alcohol use: Yes    Comment: occasionally  . Drug use: No    No Known Allergies  Current Outpatient Medications  Medication Sig Dispense Refill  . famotidine (PEPCID) 20 MG tablet Take 1 tablet (20 mg total) by mouth 2 (two) times daily. 60 tablet 5  . loratadine (CLARITIN) 10 MG tablet Take 10 mg by mouth daily as needed for allergies.     . Melatonin ER 5 MG TBCR Take 5 mg by mouth at bedtime as needed (for sleep).     Marland Kitchen  naproxen (NAPROSYN) 500 MG tablet Take 1 tablet (500 mg total) by mouth 2 (two) times daily with a meal. 60 tablet 5  . polyethylene glycol powder (GLYCOLAX/MIRALAX) powder Take 17 g by mouth daily. (Patient taking differently: Take 17 g by mouth daily as needed for moderate constipation. ) 500 g 5  . SUMAtriptan (IMITREX) 100 MG tablet Take 100 mg by mouth every 2 (two) hours as needed for migraine. May repeat in 2 hours if headache persists or recurs.     No current facility-administered medications for this visit.     Physical Exam  Pulse 97, resp. rate 16, height 5\' 5"  (1.651 m), weight 210 lb (95.3 kg), last menstrual period 05/28/2012.  Constitutional: overall normal hygiene, normal nutrition, well developed, normal grooming, normal body habitus. Assistive device:none  Musculoskeletal: gait and station Limp none, muscle tone and strength are normal, no tremors or atrophy is  present.  .  Neurological: coordination overall normal.  Deep tendon reflex/nerve stretch intact.  Sensation normal.  Cranial nerves II-XII intact.   Skin:   Normal overall no scars, lesions, ulcers or rashes. No psoriasis.  Psychiatric: Alert and oriented x 3.  Recent memory intact, remote memory unclear.  Normal mood and affect. Well groomed.  Good eye contact.  Cardiovascular: overall no swelling, no varicosities, no edema bilaterally, normal temperatures of the legs and arms, no clubbing, cyanosis and good capillary refill.  Lymphatic: palpation is normal.  Left shoulder motion is full with tenderness in the extremes.  NV intact.  All other systems reviewed and are negative   The patient has been educated about the nature of the problem(s) and counseled on treatment options.  The patient appeared to understand what I have discussed and is in agreement with it.  Encounter Diagnosis  Name Primary?  . Chronic left shoulder pain Yes    PLAN Call if any problems.  Precautions discussed.  Continue current medications.   Return to clinic 6 weeks   Electronically Signed 07/28/2012, MD 3/16/20218:50 AM

## 2019-12-16 ENCOUNTER — Ambulatory Visit: Payer: 59 | Admitting: Orthopaedic Surgery
